# Patient Record
Sex: Female | Born: 1940
Health system: Southern US, Community
[De-identification: ages and names within clinical notes are randomized; demographics above are authoritative.]

## PROBLEM LIST (undated history)

## (undated) DIAGNOSIS — E7800 Pure hypercholesterolemia, unspecified: Secondary | ICD-10-CM

## (undated) DIAGNOSIS — I10 Essential (primary) hypertension: Secondary | ICD-10-CM

## (undated) DIAGNOSIS — M1990 Unspecified osteoarthritis, unspecified site: Secondary | ICD-10-CM

## (undated) HISTORY — PX: CARDIAC CATHETERIZATION: SHX172

---

## 2004-11-26 ENCOUNTER — Ambulatory Visit: Payer: Self-pay

## 2006-02-02 ENCOUNTER — Ambulatory Visit: Payer: Self-pay | Admitting: Nurse Practitioner

## 2006-04-22 ENCOUNTER — Ambulatory Visit: Payer: Self-pay

## 2007-04-20 ENCOUNTER — Ambulatory Visit: Payer: Self-pay | Admitting: Gastroenterology

## 2007-05-20 ENCOUNTER — Ambulatory Visit: Payer: Self-pay | Admitting: Family Medicine

## 2010-01-17 ENCOUNTER — Ambulatory Visit: Payer: Self-pay | Admitting: Family Medicine

## 2011-01-20 ENCOUNTER — Ambulatory Visit: Payer: Self-pay | Admitting: Internal Medicine

## 2012-02-24 ENCOUNTER — Ambulatory Visit: Payer: Self-pay

## 2013-03-08 ENCOUNTER — Emergency Department: Payer: Self-pay | Admitting: Internal Medicine

## 2013-10-25 ENCOUNTER — Ambulatory Visit: Payer: Self-pay | Admitting: Internal Medicine

## 2015-10-22 ENCOUNTER — Encounter: Payer: Self-pay | Admitting: *Deleted

## 2015-10-23 ENCOUNTER — Encounter
Admission: RE | Disposition: A | Discharge status: Home / Self Care | Payer: Self-pay | Source: Ambulatory Visit | Attending: Ophthalmology

## 2015-10-23 ENCOUNTER — Ambulatory Visit: Payer: Medicare Other | Admitting: Anesthesiology

## 2015-10-23 ENCOUNTER — Ambulatory Visit
Admission: RE | Admit: 2015-10-23 | Discharge: 2015-10-23 | Disposition: A | Discharge status: Home / Self Care | Payer: Medicare Other | Source: Ambulatory Visit | Attending: Ophthalmology | Admitting: Ophthalmology

## 2015-10-23 ENCOUNTER — Encounter: Payer: Self-pay | Admitting: *Deleted

## 2015-10-23 DIAGNOSIS — E7800 Pure hypercholesterolemia, unspecified: Secondary | ICD-10-CM | POA: Insufficient documentation

## 2015-10-23 DIAGNOSIS — Z79899 Other long term (current) drug therapy: Secondary | ICD-10-CM | POA: Insufficient documentation

## 2015-10-23 DIAGNOSIS — M1991 Primary osteoarthritis, unspecified site: Secondary | ICD-10-CM | POA: Diagnosis not present

## 2015-10-23 DIAGNOSIS — H2511 Age-related nuclear cataract, right eye: Secondary | ICD-10-CM | POA: Insufficient documentation

## 2015-10-23 DIAGNOSIS — I10 Essential (primary) hypertension: Secondary | ICD-10-CM | POA: Diagnosis not present

## 2015-10-23 HISTORY — PX: CATARACT EXTRACTION W/PHACO: SHX586

## 2015-10-23 HISTORY — DX: Unspecified osteoarthritis, unspecified site: M19.90

## 2015-10-23 HISTORY — DX: Essential (primary) hypertension: I10

## 2015-10-23 SURGERY — PHACOEMULSIFICATION, CATARACT, WITH IOL INSERTION
Anesthesia: Monitor Anesthesia Care | Site: Eye | Laterality: Right | Wound class: Clean

## 2015-10-23 MED ORDER — CARBACHOL 0.01 % IO SOLN
INTRAOCULAR | Status: DC | PRN
  Administered 2015-10-23: .5 mL via INTRAOCULAR

## 2015-10-23 MED ORDER — TETRACAINE HCL 0.5 % OP SOLN
1.0000 [drp] | Freq: Once | OPHTHALMIC | Status: AC
  Administered 2015-10-23: 1 [drp] via OPHTHALMIC

## 2015-10-23 MED ORDER — MOXIFLOXACIN HCL 0.5 % OP SOLN: 1.0000 [drp] | OPHTHALMIC | Status: DC | PRN

## 2015-10-23 MED ORDER — TETRACAINE HCL 0.5 % OP SOLN
OPHTHALMIC | Status: AC
  Administered 2015-10-23: 1 [drp] via OPHTHALMIC
  Filled 2015-10-23: qty 2

## 2015-10-23 MED ORDER — NA CHONDROIT SULF-NA HYALURON 40-17 MG/ML IO SOLN
INTRAOCULAR | Status: AC
  Filled 2015-10-23: qty 1

## 2015-10-23 MED ORDER — MIDAZOLAM HCL 2 MG/2ML IJ SOLN
INTRAMUSCULAR | Status: DC | PRN
  Administered 2015-10-23: 1 mg via INTRAVENOUS

## 2015-10-23 MED ORDER — POVIDONE-IODINE 5 % OP SOLN
OPHTHALMIC | Status: AC
  Administered 2015-10-23: 1 via OPHTHALMIC
  Filled 2015-10-23: qty 30

## 2015-10-23 MED ORDER — ARMC OPHTHALMIC DILATING GEL
1.0000 "application " | OPHTHALMIC | Status: AC | PRN
  Administered 2015-10-23 (×2): 1 via OPHTHALMIC

## 2015-10-23 MED ORDER — ARMC OPHTHALMIC DILATING GEL
OPHTHALMIC | Status: AC
  Administered 2015-10-23: 1 via OPHTHALMIC
  Filled 2015-10-23: qty 0.25

## 2015-10-23 MED ORDER — POVIDONE-IODINE 5 % OP SOLN
1.0000 "application " | Freq: Once | OPHTHALMIC | Status: AC
  Administered 2015-10-23: 1 via OPHTHALMIC

## 2015-10-23 MED ORDER — EPINEPHRINE HCL 1 MG/ML IJ SOLN
INTRAMUSCULAR | Status: DC | PRN
  Administered 2015-10-23: 1 mL via OPHTHALMIC

## 2015-10-23 MED ORDER — MOXIFLOXACIN HCL 0.5 % OP SOLN
OPHTHALMIC | Status: AC
  Filled 2015-10-23: qty 3

## 2015-10-23 MED ORDER — FENTANYL CITRATE (PF) 100 MCG/2ML IJ SOLN
INTRAMUSCULAR | Status: DC | PRN
  Administered 2015-10-23: 50 ug via INTRAVENOUS

## 2015-10-23 MED ORDER — MOXIFLOXACIN HCL 0.5 % OP SOLN
OPHTHALMIC | Status: DC | PRN
  Administered 2015-10-23: 1 [drp] via OPHTHALMIC

## 2015-10-23 MED ORDER — EPINEPHRINE HCL 1 MG/ML IJ SOLN
INTRAMUSCULAR | Status: AC
  Filled 2015-10-23: qty 1

## 2015-10-23 MED ORDER — CEFUROXIME OPHTHALMIC INJECTION 1 MG/0.1 ML
INJECTION | OPHTHALMIC | Status: AC
  Filled 2015-10-23: qty 0.1

## 2015-10-23 MED ORDER — CEFUROXIME OPHTHALMIC INJECTION 1 MG/0.1 ML
INJECTION | OPHTHALMIC | Status: DC | PRN
  Administered 2015-10-23: .1 mL via INTRACAMERAL

## 2015-10-23 MED ORDER — SODIUM CHLORIDE 0.9 % IV SOLN
INTRAVENOUS | Status: DC
  Administered 2015-10-23: 10:00:00 via INTRAVENOUS

## 2015-10-23 MED ORDER — NA CHONDROIT SULF-NA HYALURON 40-17 MG/ML IO SOLN
INTRAOCULAR | Status: DC | PRN
  Administered 2015-10-23: 1 mL via INTRAOCULAR

## 2015-10-23 SURGICAL SUPPLY — 22 items
CANNULA ANT/CHMB 27GA (MISCELLANEOUS) ×3 IMPLANT
CUP MEDICINE 2OZ PLAST GRAD ST (MISCELLANEOUS) ×3 IMPLANT
GLOVE BIO SURGEON STRL SZ8 (GLOVE) ×3 IMPLANT
GLOVE BIOGEL M 6.5 STRL (GLOVE) ×3 IMPLANT
GLOVE SURG LX 8.0 MICRO (GLOVE) ×2
GLOVE SURG LX STRL 8.0 MICRO (GLOVE) ×1 IMPLANT
GOWN STRL REUS W/ TWL LRG LVL3 (GOWN DISPOSABLE) ×2 IMPLANT
GOWN STRL REUS W/TWL LRG LVL3 (GOWN DISPOSABLE) ×4
LENS IOL TECNIS 20.0 (Intraocular Lens) ×3 IMPLANT
LENS IOL TECNIS MONO 1P 20.0 (Intraocular Lens) ×1 IMPLANT
PACK CATARACT (MISCELLANEOUS) ×3 IMPLANT
PACK CATARACT BRASINGTON LX (MISCELLANEOUS) ×3 IMPLANT
PACK EYE AFTER SURG (MISCELLANEOUS) ×3 IMPLANT
SOL BSS BAG (MISCELLANEOUS) ×3
SOL PREP PVP 2OZ (MISCELLANEOUS) ×3
SOLUTION BSS BAG (MISCELLANEOUS) ×1 IMPLANT
SOLUTION PREP PVP 2OZ (MISCELLANEOUS) ×1 IMPLANT
SYR 3ML LL SCALE MARK (SYRINGE) ×3 IMPLANT
SYR 5ML LL (SYRINGE) ×3 IMPLANT
SYR TB 1ML 27GX1/2 LL (SYRINGE) ×3 IMPLANT
WATER STERILE IRR 1000ML POUR (IV SOLUTION) ×3 IMPLANT
WIPE NON LINTING 3.25X3.25 (MISCELLANEOUS) ×3 IMPLANT

## 2015-10-23 NOTE — Transfer of Care (Signed)
Immediate Anesthesia Transfer of Care Note  Patient: Emily Bolton  Procedure(s) Performed: Procedure(s) with comments: CATARACT EXTRACTION PHACO AND INTRAOCULAR LENS PLACEMENT (IOC) (Right) - US 00:47 AP% 27.4 CDE 13.00 fluid pack lot # [id number]  Patient Location: PACU  Anesthesia Type:MAC  Level of Consciousness: awake, alert  and oriented  Airway & Oxygen Therapy: Patient Spontanous Breathing  Post-op Assessment: Report given to RN and Post -op Vital signs reviewed and stable  Post vital signs: Reviewed and stable  Last Vitals:  Filed Vitals:   10/23/15 0944 10/23/15 1120  BP: 163/65 151/71  Pulse: 76 74  Temp: 36.2 C   Resp: 16     Last Pain: There were no vitals filed for this visit.       Complications: No apparent anesthesia complications

## 2015-10-23 NOTE — Anesthesia Preprocedure Evaluation (Signed)
Anesthesia Evaluation  Patient identified by MRN, date of birth, ID band Patient awake    Reviewed: Allergy & Precautions, H&P , NPO status , Patient's Chart, lab work & pertinent test results  History of Anesthesia Complications Negative for: history of anesthetic complications  Airway Mallampati: III  TM Distance: >3 FB Neck ROM: limited    Dental  (+) Poor Dentition, Missing, Upper Dentures, Lower Dentures   Pulmonary neg pulmonary ROS, neg shortness of breath,    Pulmonary exam normal breath sounds clear to auscultation       Cardiovascular Exercise Tolerance: Good hypertension, negative cardio ROS Normal cardiovascular exam Rhythm:regular Rate:Normal     Neuro/Psych negative neurological ROS  negative psych ROS   GI/Hepatic negative GI ROS, Neg liver ROS,   Endo/Other  negative endocrine ROS  Renal/GU negative Renal ROS  negative genitourinary   Musculoskeletal  (+) Arthritis ,   Abdominal   Peds  Hematology negative hematology ROS (+)   Anesthesia Other Findings Past Medical History:   Hypertension                                                 Arthritis                   HLD                                  History reviewed. No pertinent surgical history.  BMI    Body Mass Index   19.73 kg/m 2      Reproductive/Obstetrics negative OB ROS                             Anesthesia Physical Anesthesia Plan  ASA: III  Anesthesia Plan: MAC   Post-op Pain Management:    Induction:   Airway Management Planned:   Additional Equipment:   Intra-op Plan:   Post-operative Plan:   Informed Consent: I have reviewed the patients History and Physical, chart, labs and discussed the procedure including the risks, benefits and alternatives for the proposed anesthesia with the patient or authorized representative who has indicated his/her understanding and acceptance.   Dental  Advisory Given  Plan Discussed with: Anesthesiologist, CRNA and Surgeon  Anesthesia Plan Comments: (Consent via video interpreter )        Anesthesia Quick Evaluation

## 2015-10-23 NOTE — Anesthesia Postprocedure Evaluation (Signed)
Anesthesia Post Note  Patient: Emily Bolton  Procedure(s) Performed: Procedure(s) (LRB): CATARACT EXTRACTION PHACO AND INTRAOCULAR LENS PLACEMENT (IOC) (Right)  Patient location during evaluation: PACU Anesthesia Type: MAC Level of consciousness: awake, awake and alert and oriented Pain management: pain level controlled Vital Signs Assessment: post-procedure vital signs reviewed and stable Respiratory status: spontaneous breathing, nonlabored ventilation and respiratory function stable Cardiovascular status: stable Anesthetic complications: no    Last Vitals:  Filed Vitals:   10/23/15 0944 10/23/15 1120  BP: 163/65 151/71  Pulse: 76 74  Temp: 36.2 C   Resp: 16     Last Pain: There were no vitals filed for this visit.               [employer]so[employer]

## 2015-10-23 NOTE — Op Note (Signed)
PREOPERATIVE DIAGNOSIS:  Nuclear sclerotic cataract of the right eye.   POSTOPERATIVE DIAGNOSIS: nuclear sclerotic cataract right eye   OPERATIVE PROCEDURE:  Procedure(s): CATARACT EXTRACTION PHACO AND INTRAOCULAR LENS PLACEMENT (IOC)   SURGEON:  Galen ManilaWilliam Dyesha Henault, MD.   ANESTHESIA:  Anesthesiologist: Rosaria FerriesJoseph K Piscitello, MD CRNA: Casey Burkitthuy Hoang, CRNA  1.      Managed anesthesia care. 2.      Topical tetracaine drops followed by 2% Xylocaine jelly applied in the preoperative holding area.   COMPLICATIONS:  None.   TECHNIQUE:   Stop and chop   DESCRIPTION OF PROCEDURE:  The patient was examined and consented in the preoperative holding area where the aforementioned topical anesthesia was applied to the right eye and then brought back to the Operating Room where the right eye was prepped and draped in the usual sterile ophthalmic fashion and a lid speculum was placed. A paracentesis was created with the side port blade and the anterior chamber was filled with viscoelastic. A near clear corneal incision was performed with the steel keratome. A continuous curvilinear capsulorrhexis was performed with a cystotome followed by the capsulorrhexis forceps. Hydrodissection and hydrodelineation were carried out with BSS on a blunt cannula. The lens was removed in a stop and chop  technique and the remaining cortical material was removed with the irrigation-aspiration handpiece. The capsular bag was inflated with viscoelastic and the Technis ZCB00  lens was placed in the capsular bag without complication. The remaining viscoelastic was removed from the eye with the irrigation-aspiration handpiece. The wounds were hydrated. The anterior chamber was flushed with Miostat and the eye was inflated to physiologic pressure. 0.1 mL of cefuroxime concentration 10 mg/mL was placed in the anterior chamber. The wounds were found to be water tight. The eye was dressed with Vigamox. The patient was given protective glasses to  wear throughout the day and a shield with which to sleep tonight. The patient was also given drops with which to begin a drop regimen today and will follow-up with me in one day.  Implant Name Type Inv. Item Serial No. Manufacturer Lot No. LRB No. Used  LENS IOL TECNIS 20.0 - [id number] Intraocular Lens LENS IOL TECNIS 20.0 [id number] AMO [id number] Right 1   Procedure(s) with comments: CATARACT EXTRACTION PHACO AND INTRAOCULAR LENS PLACEMENT (IOC) (Right) - US 00:47 AP% 27.4 CDE 13.00 fluid pack lot # [id number]  Electronically signed: Kendric Sindelar LOUIS 10/23/2015 11:19 AM

## 2015-10-23 NOTE — H&P (Signed)
  All labs reviewed. Abnormal studies sent to patients PCP when indicated.  Previous H&P reviewed, patient examined, there are NO CHANGES.  Emily Bolton LOUIS5/9/201710:54 AM

## 2015-10-23 NOTE — Discharge Instructions (Signed)
Eye Surgery Discharge Instructions  Expect mild scratchy sensation or mild soreness. DO NOT RUB YOUR EYE!  The day of surgery:  Minimal physical activity, but bed rest is not required  No reading, computer work, or close hand work  No bending, lifting, or straining.  May watch TV  For 24 hours:  No driving, legal decisions, or alcoholic beverages  Safety precautions  Eat anything you prefer: It is better to start with liquids, then soup then solid foods.  _____ Eye patch should be worn until postoperative exam tomorrow.  ____ Solar shield eyeglasses should be worn for comfort in the sunlight/patch while sleeping  Resume all regular medications including aspirin or Coumadin if these were discontinued prior to surgery. You may shower, bathe, shave, or wash your hair. Tylenol may be taken for mild discomfort.  Call your doctor if you experience significant pain, nausea, or vomiting, fever > 101 or other signs of infection. [phone] or [phone] Specific instructions:  Follow-up Information    Follow up with Carlena BjornstadPORFILIO,WILLIAM LOUIS, MD.   Specialty:  Ophthalmology   Why:  10-24-15 at 9:45   Contact information:   [address] [place] [address] [phone]      Eye Surgery Discharge Instructions  Expect mild scratchy sensation or mild soreness. DO NOT RUB YOUR EYE!  The day of surgery:  Minimal physical activity, but bed rest is not required  No reading, computer work, or close hand work  No bending, lifting, or straining.  May watch TV  For 24 hours:  No driving, legal decisions, or alcoholic beverages  Safety precautions  Eat anything you prefer: It is better to start with liquids, then soup then solid foods.  _____ Eye patch should be worn until postoperative exam tomorrow.  ____ Solar shield eyeglasses should be worn for comfort in the sunlight/patch while sleeping  Resume all regular medications including aspirin or Coumadin if  these were discontinued prior to surgery. You may shower, bathe, shave, or wash your hair. Tylenol may be taken for mild discomfort.  Call your doctor if you experience significant pain, nausea, or vomiting, fever > 101 or other signs of infection. [phone] or [phone] Specific instructions:  Follow-up Information    Follow up with Carlena BjornstadPORFILIO,WILLIAM LOUIS, MD.   Specialty:  Ophthalmology   Why:  10-24-15 at 9:45   Contact information:   [address] [place] [address] [phone]

## 2015-11-06 ENCOUNTER — Encounter: Payer: Self-pay | Admitting: *Deleted

## 2015-11-09 ENCOUNTER — Encounter: Payer: Self-pay | Admitting: *Deleted

## 2015-11-12 ENCOUNTER — Emergency Department: Payer: Medicare Other

## 2015-11-12 ENCOUNTER — Emergency Department
Admission: EM | Admit: 2015-11-12 | Discharge: 2015-11-12 | Disposition: A | Discharge status: Home / Self Care | Payer: Medicare Other | Attending: Emergency Medicine | Admitting: Emergency Medicine

## 2015-11-12 ENCOUNTER — Encounter: Payer: Self-pay | Admitting: Emergency Medicine

## 2015-11-12 ENCOUNTER — Other Ambulatory Visit: Payer: Self-pay

## 2015-11-12 DIAGNOSIS — R509 Fever, unspecified: Secondary | ICD-10-CM

## 2015-11-12 DIAGNOSIS — K529 Noninfective gastroenteritis and colitis, unspecified: Secondary | ICD-10-CM | POA: Diagnosis not present

## 2015-11-12 DIAGNOSIS — M545 Low back pain: Secondary | ICD-10-CM | POA: Diagnosis present

## 2015-11-12 DIAGNOSIS — I10 Essential (primary) hypertension: Secondary | ICD-10-CM | POA: Insufficient documentation

## 2015-11-12 DIAGNOSIS — M1990 Unspecified osteoarthritis, unspecified site: Secondary | ICD-10-CM | POA: Insufficient documentation

## 2015-11-12 LAB — CBC WITH DIFFERENTIAL/PLATELET
Basophils Absolute: 0 10*3/uL (ref 0–0.1)
Eosinophils Absolute: 0.3 10*3/uL (ref 0–0.7)
Eosinophils Relative: 3 %
HEMATOCRIT: 27.1 % — AB (ref 35.0–47.0)
Hemoglobin: 9.2 g/dL — ABNORMAL LOW (ref 12.0–16.0)
Lymphocytes Relative: 17 %
Lymphs Abs: 1.7 10*3/uL (ref 1.0–3.6)
MCH: 28.9 pg (ref 26.0–34.0)
MCHC: 34 g/dL (ref 32.0–36.0)
MCV: 85 fL (ref 80.0–100.0)
MONO ABS: 1.3 10*3/uL — AB (ref 0.2–0.9)
Neutro Abs: 6.7 10*3/uL — ABNORMAL HIGH (ref 1.4–6.5)
Neutrophils Relative %: 67 %
Platelets: 197 10*3/uL (ref 150–440)
RBC: 3.19 MIL/uL — ABNORMAL LOW (ref 3.80–5.20)
RDW: 13.5 % (ref 11.5–14.5)
WBC: 10 10*3/uL (ref 3.6–11.0)

## 2015-11-12 LAB — COMPREHENSIVE METABOLIC PANEL
ALBUMIN: 3.9 g/dL (ref 3.5–5.0)
ALT: 12 U/L — ABNORMAL LOW (ref 14–54)
AST: 20 U/L (ref 15–41)
Alkaline Phosphatase: 26 U/L — ABNORMAL LOW (ref 38–126)
Anion gap: 10 (ref 5–15)
BILIRUBIN TOTAL: 0.4 mg/dL (ref 0.3–1.2)
BUN: 24 mg/dL — AB (ref 6–20)
CHLORIDE: 101 mmol/L (ref 101–111)
CO2: 25 mmol/L (ref 22–32)
CREATININE: 1.43 mg/dL — AB (ref 0.44–1.00)
Calcium: 8.6 mg/dL — ABNORMAL LOW (ref 8.9–10.3)
GFR calc Af Amer: 41 mL/min — ABNORMAL LOW (ref >60)
GFR, EST NON AFRICAN AMERICAN: 35 mL/min — AB (ref >60)
Glucose, Bld: 118 mg/dL — ABNORMAL HIGH (ref 65–99)
Potassium: 3.7 mmol/L (ref 3.5–5.1)
SODIUM: 136 mmol/L (ref 135–145)
Total Protein: 7.4 g/dL (ref 6.5–8.1)

## 2015-11-12 LAB — URINALYSIS COMPLETE WITH MICROSCOPIC (ARMC ONLY)
BILIRUBIN URINE: NEGATIVE
Bacteria, UA: NONE SEEN
GLUCOSE, UA: NEGATIVE mg/dL
Hgb urine dipstick: NEGATIVE
Ketones, ur: NEGATIVE mg/dL
NITRITE: NEGATIVE
Protein, ur: NEGATIVE mg/dL
SQUAMOUS EPITHELIAL / LPF: NONE SEEN
Specific Gravity, Urine: 1.013 (ref 1.005–1.030)
pH: 6 (ref 5.0–8.0)

## 2015-11-12 LAB — LACTIC ACID, PLASMA
LACTIC ACID, VENOUS: 1.6 mmol/L (ref 0.5–2.0)
Lactic Acid, Venous: 1 mmol/L (ref 0.5–2.0)

## 2015-11-12 MED ORDER — CIPROFLOXACIN HCL 500 MG PO TABS
500.0000 mg | ORAL_TABLET | Freq: Once | ORAL | Status: AC
  Administered 2015-11-12: 500 mg via ORAL
  Filled 2015-11-12: qty 1

## 2015-11-12 MED ORDER — HYDROCODONE-ACETAMINOPHEN 5-325 MG PO TABS
1.0000 | ORAL_TABLET | Freq: Once | ORAL | Status: AC
  Administered 2015-11-12: 1 via ORAL
  Filled 2015-11-12: qty 1

## 2015-11-12 MED ORDER — METRONIDAZOLE 500 MG PO TABS: 500.0000 mg | ORAL_TABLET | Freq: Three times a day (TID) | ORAL | Status: AC

## 2015-11-12 MED ORDER — HYDROCODONE-ACETAMINOPHEN 5-325 MG PO TABS: 1.0000 | ORAL_TABLET | ORAL | Status: AC | PRN

## 2015-11-12 MED ORDER — CIPROFLOXACIN HCL 500 MG PO TABS: 500.0000 mg | ORAL_TABLET | Freq: Two times a day (BID) | ORAL | Status: AC

## 2015-11-12 MED ORDER — IOPAMIDOL (ISOVUE-300) INJECTION 61%
80.0000 mL | Freq: Once | INTRAVENOUS | Status: AC | PRN
  Administered 2015-11-12: 75 mL via INTRAVENOUS

## 2015-11-12 MED ORDER — METRONIDAZOLE 500 MG PO TABS
500.0000 mg | ORAL_TABLET | Freq: Once | ORAL | Status: AC
  Administered 2015-11-12: 500 mg via ORAL
  Filled 2015-11-12: qty 1

## 2015-11-12 MED ORDER — DIATRIZOATE MEGLUMINE & SODIUM 66-10 % PO SOLN
15.0000 mL | ORAL | Status: AC
  Administered 2015-11-12 (×2): 15 mL via ORAL

## 2015-11-12 MED ORDER — SODIUM CHLORIDE 0.9 % IV BOLUS (SEPSIS)
1000.0000 mL | Freq: Once | INTRAVENOUS | Status: AC
  Administered 2015-11-12: 1000 mL via INTRAVENOUS

## 2015-11-12 NOTE — Discharge Instructions (Signed)
Please take your antibiotics as prescribed. Pain medication as needed. Please follow-up your primary care physician in 24-48 hours for recheck/reevaluation. Return to the emergency department if you have worsening abdominal pain, Fever greater than 101, Or any other symptom personally concerning to yourself.    ?au b?ng, Ng??i l?n  (Abdominal Pain, Adult)    C nhi?u nguyn nhn d?n ??n ?au b?ng. Thng th??ng ?au b?ng l do m?t b?nh gy ra v s? khng ?? n?u khng ?i?u tr?Marland Kitchen. B?nh ny c th? ???c theo di v ?i?u tr? t?i nh. Chuyn gia ch?m [place] s?c kh?e s? ti?n hnh khm th?c th? v c th? yu c?u lm xt nghi?m mu v ch?p X quang ?? xc ??nh m?c ?? nghim tr?ng c?a c?n ?au b?ng. Tuy nhin, trong nhi?u tr??ng h?p, ph?i m?t nhi?u th?i gian h?n ?? xc ??nh r nguyn nhn gy ?au b?ng. Tr??c khi tm ra nguyn nhn, chuyn gia ch?m [place] s?c kh?e c th? khng bi?t li?u qu v? c c?n lm thm xt nghi?m ho?c ti?p t?c ?i?u tr? hay khng.  H??NG D?N CH?M [place] T?I NH  Theo di c?n ?au b?ng xem c b?t k? thay ??i no khng. Nh?ng hnh ??ng sau c th? gip lo?i b? b?t c? c?m gic kh ch?u no qu v? ?ang b?.  Ch? s? d?ng thu?c khng c?n k ??n ho?c thu?c c?n k ??n theo ch? d?n c?a chuyn gia ch?m [place] s?c kh?e.  Khng dng thu?c nhu?n trng tr? khi ???c chuyn gia ch?m [place] s?c kh?e ch? ??nh.  Th? dng m?t ch? ?? ?n l?ng (n??c lu?c th?t, tr, ho?c n??c) theo ch? d?n c?a chuyn gia ch?m [place] s?c kh?e. Chuy?n d?n sang m?t ch? ?? ?n nh? n?u ch?u ???c. ?I KHM N?U:  Qu v? b? ?au b?ng khng r nguyn nhn.  Qu v? b? ?au b?ng km theo bu?n nn ho?c tiu ch?y.  Qu v? b? ?au khi ?i ti?u ho?c ?i ngoi.  Qu v? b? c?n ?au b?ng lm th?c gi?c vo ban ?m.  Qu v? b? ?au b?ng n?ng thm ho?c ?? h?n khi ?n.  Qu v? b? ?au b?ng n?ng thm khi ?n ?? ?n nhi?u ch?t bo.  Qu v? b? s?t. NGAY L?P T?C ?I KHM N?U:  C?n ?au khng kh?i trong vng 2 gi?Ladell Heads.  Qu v? v?n ti?p t?c b? i (nn m?a).  Ch? c th? c?m th?y ?au ? m?t s?  ph?n b?ng, ch?ng h?n nh? ? ph?n b?ng bn ph?i ho?c bn d??i tri.  Phn c?a qu v? c mu ho?c c mu ?en nh? h?c n. ??M B?O QU V?:  Hi?u r cc h??ng d?n ny.  S? theo di tnh tr?ng c?a mnh.  S? yu c?u tr? gip ngay l?p t?c n?u qu v? c?m th?y khng kh?e ho?c th?y tr?m tr?ng h?n. Thng tin ny khng nh?m m?c ?ch thay th? cho l?i khuyn m chuyn gia ch?m  s?c kh?e ni v?i qu v?. Hy b?o ??m qu v? ph?i th?o lu?n b?t k? v?n ?? g m qu v? c v?i chuyn gia ch?m  s?c kh?e c?a qu v?.  Document Released: 06/02/2005 Document Revised: 06/23/2014  Elsevier Interactive Patient Education [employer]2016 Elsevier Inc.

## 2015-11-12 NOTE — ED Notes (Signed)
Patient transported to CT 

## 2015-11-12 NOTE — ED Notes (Addendum)
Used Therapist, sportsvideo vietnamese interpreter [phone]).  Patient reports back pain x 1 week that is radiates into shoulders and neck.  Patient is also complaining of left hip and leg pain.  Patient states her left leg has been hurting her x 2 weeks.  Patient denies having fallen any in the past several weeks.  Patient denies any injury.  Patient states her doctor gave her some pain medication but it has not been helping.  Patient has a fever during triage.  Patient denies shortness of breath.  Patient denies any urinary frequency or burning.  Patient denies cough or congestion.  Patient is complaining of sharp headache x 4 days.    Patient is smiling occasionally during triage.  Patient is oriented to self, and place but not time.

## 2015-11-12 NOTE — ED Provider Notes (Signed)
[facility] Emergency Department Provider Note  Time seen: 8:29 PM  I have reviewed the triage vital signs and the nursing notes.   HISTORY  Chief Complaint Fever; Back Pain; and Weakness    HPI Emily Bolton is a 75 y.o. female with a past medical history of hypertension, hyperlipidemia, arthritis presents the emergency department with lower abdominal and lower back pain for the past one week with some radiation into bilateral flanks and shoulders. Denies any chest pain, trouble breathing. Denies nausea, vomiting, diarrhea, dysuria. Denies cough or congestion. Patient is describing mostly lower abdominal and lower back pain with occasional radiation of the pain, but when asked where she hurts she holds her lower abdomen and around to her bilateral flanks. Seems hurt somewhat worse on the left side per patient. Patient does have a fever in the emergency department 100.6.     Past Medical History  Diagnosis Date  . Hypertension   . Arthritis   . Hypercholesteremia     There are no active problems to display for this patient.   Past Surgical History  Procedure Laterality Date  . Cataract extraction w/phaco Right 10/23/2015    Procedure: CATARACT EXTRACTION PHACO AND INTRAOCULAR LENS PLACEMENT (IOC);  Surgeon: Galen ManilaWilliam Porfilio, MD;  Location: [facility] ORS;  Service: Ophthalmology;  Laterality: Right;  US 00:47 AP% 27.4 CDE 13.00 fluid pack lot # [id number]  . Cardiac catheterization      Current Outpatient Rx  Name  Route  Sig  Dispense  Refill  . atorvastatin (LIPITOR) 40 MG tablet   Oral   Take 40 mg by mouth daily.         Marland Kitchen. losartan-hydrochlorothiazide (HYZAAR) 100-12.5 MG tablet   Oral   Take 1 tablet by mouth daily.         . MELOXICAM PO   Oral   Take by mouth.           Allergies Review of patient's allergies indicates no known allergies.  No family history on file.  Social History Social History  Substance Use Topics  . Smoking  status: Never Smoker   . Smokeless tobacco: None  . Alcohol Use: No    Review of Systems Constitutional: Positive for fever in emergency department. Cardiovascular: Negative for chest pain. Respiratory: Negative for shortness of breath. Gastrointestinal: Lower abdominal pain, lower back pain. Negative for nausea, vomiting, diarrhea. Genitourinary: Negative for dysuria. Musculoskeletal: Lower back pain. Neurological: Negative for headache 10-point ROS otherwise negative.  ____________________________________________   PHYSICAL EXAM:  VITAL SIGNS: ED Triage Vitals  Enc Vitals Group     BP 11/12/15 1739 133/57 mmHg     Pulse Rate 11/12/15 1739 100     Resp 11/12/15 1739 20     Temp 11/12/15 1739 100.6 F (38.1 C)     Temp Source 11/12/15 1739 Oral     SpO2 11/12/15 1739 97 %     Weight 11/12/15 1739 114 lb (51.71 kg)     Height 11/12/15 1739 4' 11.06" (1.5 m)     Head Cir --      Peak Flow --      Pain Score --      Pain Loc --      Pain Edu? --      Excl. in GC? --     Constitutional: Alert and oriented. Well appearing and in no distress. Eyes: Normal exam ENT   Head: Normocephalic and atraumatic.   Mouth/Throat: Mucous membranes are moist. Cardiovascular: Normal  rate, regular rhythm. No murmur Respiratory: Normal respiratory effort without tachypnea nor retractions. Breath sounds are clear  Gastrointestinal: Soft and nontender. No distention.  Musculoskeletal: Nontender with normal range of motion in all extremities.  Neurologic:  Normal speech and language. No gross focal neurologic deficits Skin:  Skin is warm, dry and intact.  Psychiatric: Mood and affect are normal.  ____________________________________________    EKG  EKG reviewed and interpreted by myself shows normal sinus rhythm at 94 bpm, narrow QRS, normal axis, normal intervals, nonspecific ST changes.  ____________________________________________    RADIOLOGY  CT shows possible  enteritis.  ____________________________________________   INITIAL IMPRESSION / ASSESSMENT AND PLAN / ED COURSE  Pertinent labs & imaging results that were available during my care of the patient were reviewed by me and considered in my medical decision making (see chart for details).  The patient presents to the emergency department for lower abdominal and lower back pain, but also describes radiation of the pain into her flanks, into her shoulders and neck, and 4 days of mild to moderate headache. When asked where most of her discomfort as she points to her lower abdomen and off to her left flank. Patient's labs are largely nonrevealing. As the patient did have a fever upon presentation to the emergency department we'll proceed with a CT abdomen/pelvis help further evaluate. On exam the patient does have mild to moderate lower abdominal pain. Patient is urinalysis is negative. Patient denies diarrhea, however the patient could possibly be experiencing: Eyes are diverticulitis, possibly an atypical appendicitis. No right upper quadrant tenderness. Liver function tests are normal. Overall it is a difficult examination as the patient speaks [place] and we are using a video interpreter for the evaluation.   Patient's labs have resulted largely within normal limits. Lactate is normal. Patient CT scan shows air-fluid levels, but no obvious obstruction or infection. Likely suggestive of enteritis. Given the patient's abdominal pain and fever we will cover with antibiotics (Cipro/Flagyl), pain medication as needed, and have the patient follow up with her primary care doctor within one to 2 days for recheck/reevaluation. I discussed very strict return precautions with the patient via a hospital interpreter. Patient and family are agreeable to this plan. As we did dose IV contrast with borderline kidney function we will dose and additional liter of fluid to ensure adequate kidney  hydration.  ____________________________________________   FINAL CLINICAL IMPRESSION(S) / ED DIAGNOSES  Abdominal pain Fever Enteritis  Minna Antis, MD 11/12/15 2246

## 2015-11-13 ENCOUNTER — Ambulatory Visit: Payer: Medicare Other | Admitting: Anesthesiology

## 2015-11-13 ENCOUNTER — Ambulatory Visit
Admission: RE | Admit: 2015-11-13 | Discharge: 2015-11-13 | Disposition: A | Discharge status: Home / Self Care | Payer: Medicare Other | Source: Ambulatory Visit | Attending: Ophthalmology | Admitting: Ophthalmology

## 2015-11-13 ENCOUNTER — Encounter
Admission: RE | Disposition: A | Discharge status: Home / Self Care | Payer: Self-pay | Source: Ambulatory Visit | Attending: Ophthalmology

## 2015-11-13 DIAGNOSIS — H2512 Age-related nuclear cataract, left eye: Secondary | ICD-10-CM | POA: Diagnosis not present

## 2015-11-13 DIAGNOSIS — M1990 Unspecified osteoarthritis, unspecified site: Secondary | ICD-10-CM | POA: Diagnosis not present

## 2015-11-13 DIAGNOSIS — I10 Essential (primary) hypertension: Secondary | ICD-10-CM | POA: Insufficient documentation

## 2015-11-13 DIAGNOSIS — E785 Hyperlipidemia, unspecified: Secondary | ICD-10-CM | POA: Insufficient documentation

## 2015-11-13 HISTORY — DX: Pure hypercholesterolemia, unspecified: E78.00

## 2015-11-13 HISTORY — PX: CATARACT EXTRACTION W/PHACO: SHX586

## 2015-11-13 SURGERY — PHACOEMULSIFICATION, CATARACT, WITH IOL INSERTION
Anesthesia: Monitor Anesthesia Care | Laterality: Left

## 2015-11-13 MED ORDER — CARBACHOL 0.01 % IO SOLN
INTRAOCULAR | Status: DC | PRN
  Administered 2015-11-13: 0.5 mL via INTRAOCULAR

## 2015-11-13 MED ORDER — POVIDONE-IODINE 5 % OP SOLN
1.0000 "application " | Freq: Once | OPHTHALMIC | Status: AC
  Administered 2015-11-13: 1 via OPHTHALMIC

## 2015-11-13 MED ORDER — SODIUM CHLORIDE 0.9 % IV SOLN
INTRAVENOUS | Status: DC
  Administered 2015-11-13 (×2): via INTRAVENOUS

## 2015-11-13 MED ORDER — NA CHONDROIT SULF-NA HYALURON 40-17 MG/ML IO SOLN
INTRAOCULAR | Status: DC | PRN
  Administered 2015-11-13: 1 mL via INTRAOCULAR

## 2015-11-13 MED ORDER — MOXIFLOXACIN HCL 0.5 % OP SOLN
OPHTHALMIC | Status: AC
  Filled 2015-11-13: qty 3

## 2015-11-13 MED ORDER — NA CHONDROIT SULF-NA HYALURON 40-17 MG/ML IO SOLN
INTRAOCULAR | Status: AC
Start: 2015-11-13 — End: 2015-11-13
  Filled 2015-11-13: qty 1

## 2015-11-13 MED ORDER — CEFUROXIME OPHTHALMIC INJECTION 1 MG/0.1 ML
INJECTION | OPHTHALMIC | Status: AC
  Filled 2015-11-13: qty 0.1

## 2015-11-13 MED ORDER — MOXIFLOXACIN HCL 0.5 % OP SOLN
OPHTHALMIC | Status: DC | PRN
  Administered 2015-11-13: 1 [drp] via OPHTHALMIC

## 2015-11-13 MED ORDER — ARMC OPHTHALMIC DILATING GEL
OPHTHALMIC | Status: AC
  Administered 2015-11-13: 1 via OPHTHALMIC
  Filled 2015-11-13: qty 0.25

## 2015-11-13 MED ORDER — FENTANYL CITRATE (PF) 100 MCG/2ML IJ SOLN
INTRAMUSCULAR | Status: DC | PRN
  Administered 2015-11-13: 50 ug via INTRAVENOUS

## 2015-11-13 MED ORDER — TETRACAINE HCL 0.5 % OP SOLN
OPHTHALMIC | Status: AC
  Administered 2015-11-13: 1 [drp] via OPHTHALMIC
  Filled 2015-11-13: qty 2

## 2015-11-13 MED ORDER — MOXIFLOXACIN HCL 0.5 % OP SOLN: 1.0000 [drp] | OPHTHALMIC | Status: DC | PRN

## 2015-11-13 MED ORDER — EPINEPHRINE HCL 1 MG/ML IJ SOLN
INTRAOCULAR | Status: DC | PRN
  Administered 2015-11-13: 11:00:00 via OPHTHALMIC

## 2015-11-13 MED ORDER — TETRACAINE HCL 0.5 % OP SOLN
1.0000 [drp] | Freq: Once | OPHTHALMIC | Status: AC
  Administered 2015-11-13: 1 [drp] via OPHTHALMIC

## 2015-11-13 MED ORDER — EPINEPHRINE HCL 1 MG/ML IJ SOLN
INTRAMUSCULAR | Status: AC
  Filled 2015-11-13: qty 1

## 2015-11-13 MED ORDER — CEFUROXIME OPHTHALMIC INJECTION 1 MG/0.1 ML
INJECTION | OPHTHALMIC | Status: DC | PRN
  Administered 2015-11-13: 0.1 mL via INTRACAMERAL

## 2015-11-13 MED ORDER — ARMC OPHTHALMIC DILATING GEL
1.0000 "application " | OPHTHALMIC | Status: AC | PRN
  Administered 2015-11-13 (×2): 1 via OPHTHALMIC

## 2015-11-13 MED ORDER — POVIDONE-IODINE 5 % OP SOLN
OPHTHALMIC | Status: AC
  Administered 2015-11-13: 1 via OPHTHALMIC
  Filled 2015-11-13: qty 30

## 2015-11-13 MED ORDER — MIDAZOLAM HCL 2 MG/2ML IJ SOLN
INTRAMUSCULAR | Status: DC | PRN
  Administered 2015-11-13: 1 mg via INTRAVENOUS

## 2015-11-13 SURGICAL SUPPLY — 21 items
CANNULA ANT/CHMB 27GA (MISCELLANEOUS) ×3 IMPLANT
CUP MEDICINE 2OZ PLAST GRAD ST (MISCELLANEOUS) ×3 IMPLANT
GLOVE BIO SURGEON STRL SZ8 (GLOVE) ×3 IMPLANT
GLOVE BIOGEL M 6.5 STRL (GLOVE) ×3 IMPLANT
GLOVE SURG LX 8.0 MICRO (GLOVE) ×2
GLOVE SURG LX STRL 8.0 MICRO (GLOVE) ×1 IMPLANT
GOWN STRL REUS W/ TWL LRG LVL3 (GOWN DISPOSABLE) ×2 IMPLANT
GOWN STRL REUS W/TWL LRG LVL3 (GOWN DISPOSABLE) ×4
LENS IOL TECNIS ITEC 21.0 (Intraocular Lens) ×3 IMPLANT
PACK CATARACT (MISCELLANEOUS) ×3 IMPLANT
PACK CATARACT BRASINGTON LX (MISCELLANEOUS) ×3 IMPLANT
PACK EYE AFTER SURG (MISCELLANEOUS) ×3 IMPLANT
SOL BSS BAG (MISCELLANEOUS) ×3
SOL PREP PVP 2OZ (MISCELLANEOUS) ×3
SOLUTION BSS BAG (MISCELLANEOUS) ×1 IMPLANT
SOLUTION PREP PVP 2OZ (MISCELLANEOUS) ×1 IMPLANT
SYR 3ML LL SCALE MARK (SYRINGE) ×3 IMPLANT
SYR 5ML LL (SYRINGE) ×3 IMPLANT
SYR TB 1ML 27GX1/2 LL (SYRINGE) ×3 IMPLANT
WATER STERILE IRR 1000ML POUR (IV SOLUTION) ×3 IMPLANT
WIPE NON LINTING 3.25X3.25 (MISCELLANEOUS) ×3 IMPLANT

## 2015-11-13 NOTE — Transfer of Care (Signed)
Immediate Anesthesia Transfer of Care Note  Patient: Emily Bolton  Procedure(s) Performed: Procedure(s) with comments: CATARACT EXTRACTION PHACO AND INTRAOCULAR LENS PLACEMENT (IOC) (Left) - US 1.04 AP% 22.1 CDE 14.14 FLUID PACK LOT # [id number]  Patient Location: PACU  Anesthesia Type:MAC  Level of Consciousness: awake, alert  and oriented  Airway & Oxygen Therapy: Patient Spontanous Breathing  Post-op Assessment: Report given to RN and Post -op Vital signs reviewed and stable  Post vital signs: Reviewed and stable  Last Vitals:  Filed Vitals:   11/13/15 1002  BP: 138/58  Pulse: 81  Temp: 36.9 C  Resp: 16    Last Pain: There were no vitals filed for this visit.       Complications: No apparent anesthesia complications

## 2015-11-13 NOTE — H&P (Signed)
  All labs reviewed. Abnormal studies sent to patients PCP when indicated.  Previous H&P reviewed, patient examined, there are NO CHANGES.  Gabriel Paulding LOUIS5/30/201711:01 AM

## 2015-11-13 NOTE — Anesthesia Postprocedure Evaluation (Signed)
Anesthesia Post Note  Patient: Emily Bolton  Procedure(s) Performed: Procedure(s) (LRB): CATARACT EXTRACTION PHACO AND INTRAOCULAR LENS PLACEMENT (IOC) (Left)  Patient location during evaluation: PACU Anesthesia Type: MAC Level of consciousness: awake and alert Pain management: pain level controlled Vital Signs Assessment: post-procedure vital signs reviewed and stable Respiratory status: spontaneous breathing, nonlabored ventilation, respiratory function stable and patient connected to nasal cannula oxygen Cardiovascular status: stable and blood pressure returned to baseline Anesthetic complications: no    Last Vitals:  Filed Vitals:   11/13/15 1126 11/13/15 1144  BP: 121/48 115/52  Pulse: 76   Temp: 36.7 C   Resp: 16     Last Pain: There were no vitals filed for this visit.               Cleda MccreedyJoseph K Piscitello

## 2015-11-13 NOTE — Discharge Instructions (Signed)
Eye Surgery Discharge Instructions  Expect mild scratchy sensation or mild soreness. DO NOT RUB YOUR EYE!  The day of surgery:  Minimal physical activity, but bed rest is not required  No reading, computer work, or close hand work  No bending, lifting, or straining.  May watch TV  For 24 hours:  No driving, legal decisions, or alcoholic beverages  Safety precautions  Eat anything you prefer: It is better to start with liquids, then soup then solid foods.  _____ Eye patch should be worn until postoperative exam tomorrow.  ____ Solar shield eyeglasses should be worn for comfort in the sunlight/patch while sleeping  Resume all regular medications including aspirin or Coumadin if these were discontinued prior to surgery. You may shower, bathe, shave, or wash your hair. Tylenol may be taken for mild discomfort.  Call your doctor if you experience significant pain, nausea, or vomiting, fever > 101 or other signs of infection. [phone] or [phone] Specific instructions:  Follow-up Information    Follow up with PORFILIO,WILLIAM LOUIS, MD In 1 day.   Specialty:  Ophthalmology   Why:  May 31 at 10:35am   Contact information:   [address] [place] [address] [phone]

## 2015-11-13 NOTE — Anesthesia Preprocedure Evaluation (Addendum)
Anesthesia Evaluation  Patient identified by MRN, date of birth, ID band Patient awake    Reviewed: Allergy & Precautions, H&P , NPO status , Patient's Chart, lab work & pertinent test results  History of Anesthesia Complications Negative for: history of anesthetic complications  Airway Mallampati: III  TM Distance: <3 FB Neck ROM: limited    Dental  (+) Poor Dentition, Missing, Upper Dentures, Lower Dentures   Pulmonary neg pulmonary ROS, neg shortness of breath,    Pulmonary exam normal breath sounds clear to auscultation       Cardiovascular Exercise Tolerance: Good hypertension, negative cardio ROS Normal cardiovascular exam Rhythm:regular Rate:Normal     Neuro/Psych negative neurological ROS  negative psych ROS   GI/Hepatic negative GI ROS, Neg liver ROS,   Endo/Other  negative endocrine ROS  Renal/GU negative Renal ROS  negative genitourinary   Musculoskeletal  (+) Arthritis ,   Abdominal   Peds  Hematology negative hematology ROS (+)   Anesthesia Other Findings Past Medical History:   Hypertension                                                 Arthritis                   HLD                                  History reviewed. No pertinent surgical history.  BMI    Body Mass Index   19.73 kg/m 2      Reproductive/Obstetrics negative OB ROS                            Anesthesia Physical  Anesthesia Plan  ASA: III  Anesthesia Plan: MAC   Post-op Pain Management:    Induction:   Airway Management Planned:   Additional Equipment:   Intra-op Plan:   Post-operative Plan:   Informed Consent: I have reviewed the patients History and Physical, chart, labs and discussed the procedure including the risks, benefits and alternatives for the proposed anesthesia with the patient or authorized representative who has indicated his/her understanding and acceptance.   Dental  Advisory Given  Plan Discussed with: Anesthesiologist, CRNA and Surgeon  Anesthesia Plan Comments: (Consent via video interpreter )        Anesthesia Quick Evaluation

## 2015-11-13 NOTE — Op Note (Signed)
PREOPERATIVE DIAGNOSIS:  Nuclear sclerotic cataract of the left eye.   POSTOPERATIVE DIAGNOSIS:  NUCLEAR SCLEROTIC CATARACT LEFT EYE   OPERATIVE PROCEDURE:  Procedure(s): CATARACT EXTRACTION PHACO AND INTRAOCULAR LENS PLACEMENT (IOC)   SURGEON:  Galen ManilaWilliam Granville Whitefield, MD.   ANESTHESIA:   Anesthesiologist: Rosaria FerriesJoseph K Piscitello, MD CRNA: Evelena Peateborah Ferrero-Conover, CRNA  1.      Managed anesthesia care. 2.      Topical tetracaine drops followed by 2% Xylocaine jelly applied in the preoperative holding area.   COMPLICATIONS:  None.   TECHNIQUE:   Stop and chop   DESCRIPTION OF PROCEDURE:  The patient was examined and consented in the preoperative holding area where the aforementioned topical anesthesia was applied to the left eye and then brought back to the Operating Room where the left eye was prepped and draped in the usual sterile ophthalmic fashion and a lid speculum was placed. A paracentesis was created with the side port blade and the anterior chamber was filled with viscoelastic. A near clear corneal incision was performed with the steel keratome. A continuous curvilinear capsulorrhexis was performed with a cystotome followed by the capsulorrhexis forceps. Hydrodissection and hydrodelineation were carried out with BSS on a blunt cannula. The lens was removed in a stop and chop  technique and the remaining cortical material was removed with the irrigation-aspiration handpiece. The capsular bag was inflated with viscoelastic and the Technis ZCB00 lens was placed in the capsular bag without complication. The remaining viscoelastic was removed from the eye with the irrigation-aspiration handpiece. The wounds were hydrated. The anterior chamber was flushed with Miostat and the eye was inflated to physiologic pressure. 0.1 mL of cefuroxime concentration 10 mg/mL was placed in the anterior chamber. The wounds were found to be water tight. The eye was dressed with Vigamox. The patient was given protective  glasses to wear throughout the day and a shield with which to sleep tonight. The patient was also given drops with which to begin a drop regimen today and will follow-up with me in one day.  Implant Name Type Inv. Item Serial No. Manufacturer Lot No. LRB No. Used  LENS IOL DIOP 21.0 - [id number] Intraocular Lens LENS IOL DIOP 21.0 [id number]   Left 1   Procedure(s) with comments: CATARACT EXTRACTION PHACO AND INTRAOCULAR LENS PLACEMENT (IOC) (Left) - US 1.04 AP% 22.1 CDE 14.14 FLUID PACK LOT # [id number]  Electronically signed: Barbara Keng [place] 11/13/2015 11:25 AM

## 2015-11-17 LAB — CULTURE, BLOOD (ROUTINE X 2)
CULTURE: NO GROWTH
Culture: NO GROWTH

## 2016-03-20 ENCOUNTER — Other Ambulatory Visit: Payer: Self-pay | Admitting: Internal Medicine

## 2016-03-20 DIAGNOSIS — Z1231 Encounter for screening mammogram for malignant neoplasm of breast: Secondary | ICD-10-CM

## 2016-04-14 ENCOUNTER — Ambulatory Visit
Admission: RE | Admit: 2016-04-14 | Discharge: 2016-04-14 | Disposition: A | Discharge status: Home / Self Care | Payer: Medicare Other | Source: Ambulatory Visit | Attending: Internal Medicine | Admitting: Internal Medicine

## 2016-04-14 DIAGNOSIS — Z1231 Encounter for screening mammogram for malignant neoplasm of breast: Secondary | ICD-10-CM | POA: Diagnosis present

## 2019-04-27 ENCOUNTER — Other Ambulatory Visit: Payer: Self-pay | Admitting: Internal Medicine

## 2019-04-27 DIAGNOSIS — Z1231 Encounter for screening mammogram for malignant neoplasm of breast: Secondary | ICD-10-CM

## 2019-06-29 ENCOUNTER — Other Ambulatory Visit
Admission: RE | Admit: 2019-06-29 | Discharge: 2019-06-29 | Disposition: A | Discharge status: Home / Self Care | Payer: Medicare Other | Source: Ambulatory Visit | Attending: Sports Medicine | Admitting: Sports Medicine

## 2019-06-29 DIAGNOSIS — M25571 Pain in right ankle and joints of right foot: Secondary | ICD-10-CM | POA: Diagnosis present

## 2019-06-29 DIAGNOSIS — M19079 Primary osteoarthritis, unspecified ankle and foot: Secondary | ICD-10-CM | POA: Diagnosis present

## 2019-06-29 DIAGNOSIS — M25471 Effusion, right ankle: Secondary | ICD-10-CM | POA: Diagnosis present

## 2019-06-29 LAB — SYNOVIAL CELL COUNT + DIFF, W/ CRYSTALS
Eosinophils-Synovial: 0 %
Lymphocytes-Synovial Fld: 8 %
Monocyte-Macrophage-Synovial Fluid: 2 %
Neutrophil, Synovial: 90 %
WBC, Synovial: 27571 /mm3 — ABNORMAL HIGH (ref 0–200)

## 2019-08-23 ENCOUNTER — Ambulatory Visit
Admission: RE | Admit: 2019-08-23 | Discharge: 2019-08-23 | Disposition: A | Discharge status: Home / Self Care | Payer: Medicare Other | Source: Ambulatory Visit | Attending: Internal Medicine | Admitting: Internal Medicine

## 2019-08-23 DIAGNOSIS — Z1231 Encounter for screening mammogram for malignant neoplasm of breast: Secondary | ICD-10-CM

## 2020-11-05 ENCOUNTER — Other Ambulatory Visit: Payer: Self-pay | Admitting: Internal Medicine

## 2020-11-05 DIAGNOSIS — Z1231 Encounter for screening mammogram for malignant neoplasm of breast: Secondary | ICD-10-CM

## 2020-11-07 ENCOUNTER — Ambulatory Visit
Admission: RE | Admit: 2020-11-07 | Discharge: 2020-11-07 | Disposition: A | Discharge status: Home / Self Care | Payer: Medicare Other | Source: Ambulatory Visit | Attending: Internal Medicine | Admitting: Internal Medicine

## 2020-11-07 ENCOUNTER — Other Ambulatory Visit: Payer: Self-pay

## 2020-11-07 DIAGNOSIS — Z1231 Encounter for screening mammogram for malignant neoplasm of breast: Secondary | ICD-10-CM | POA: Insufficient documentation

## 2021-03-29 ENCOUNTER — Other Ambulatory Visit: Payer: Self-pay | Admitting: Internal Medicine

## 2021-03-29 DIAGNOSIS — N1831 Chronic kidney disease, stage 3a: Secondary | ICD-10-CM

## 2021-09-19 IMAGING — MG MM DIGITAL SCREENING BILAT W/ TOMO AND CAD
8 series · 9 of 24 positions shown · non-contrast
Comparison: Previous exam(s).

CLINICAL DATA: Screening.

EXAM:
DIGITAL SCREENING BILATERAL MAMMOGRAM WITH TOMOSYNTHESIS AND CAD
TECHNIQUE: Bilateral screening digital craniocaudal and mediolateral oblique
mammograms were obtained. Bilateral screening digital breast
tomosynthesis was performed. The images were evaluated with
computer-aided detection.

[R CC synth-2D]
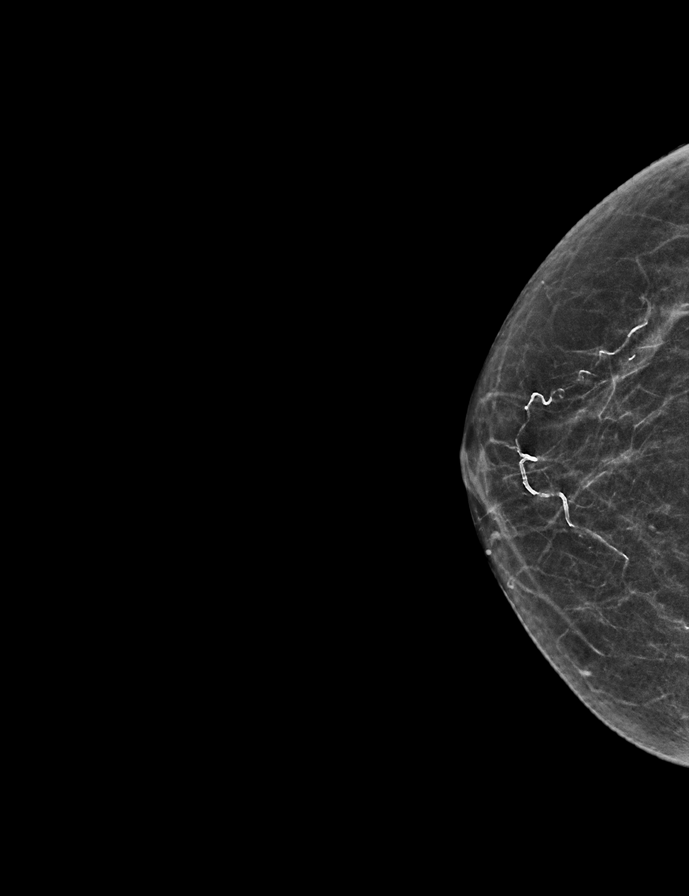

[L MLO synth-2D]
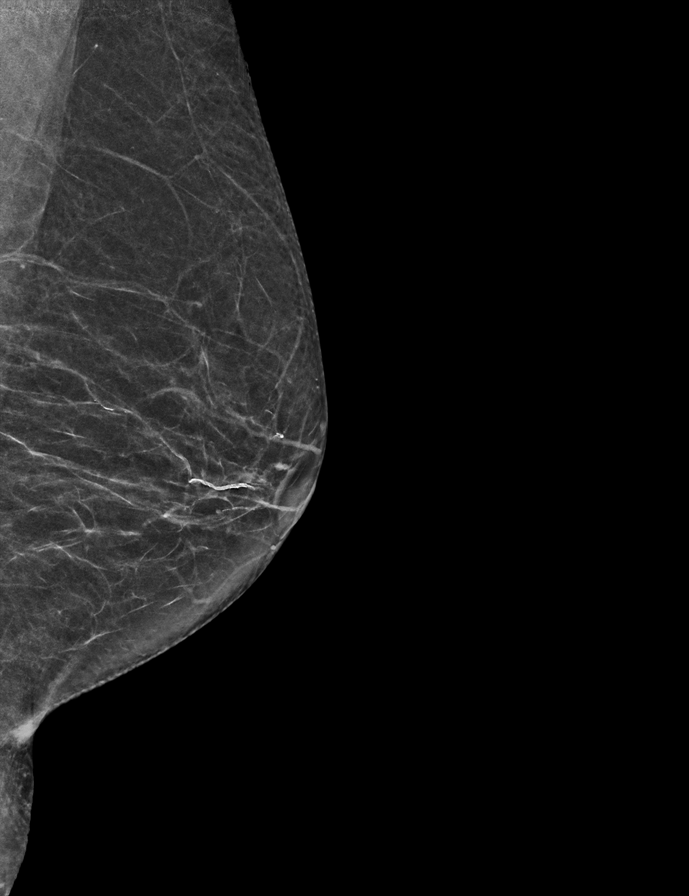

[L CC synth-2D]
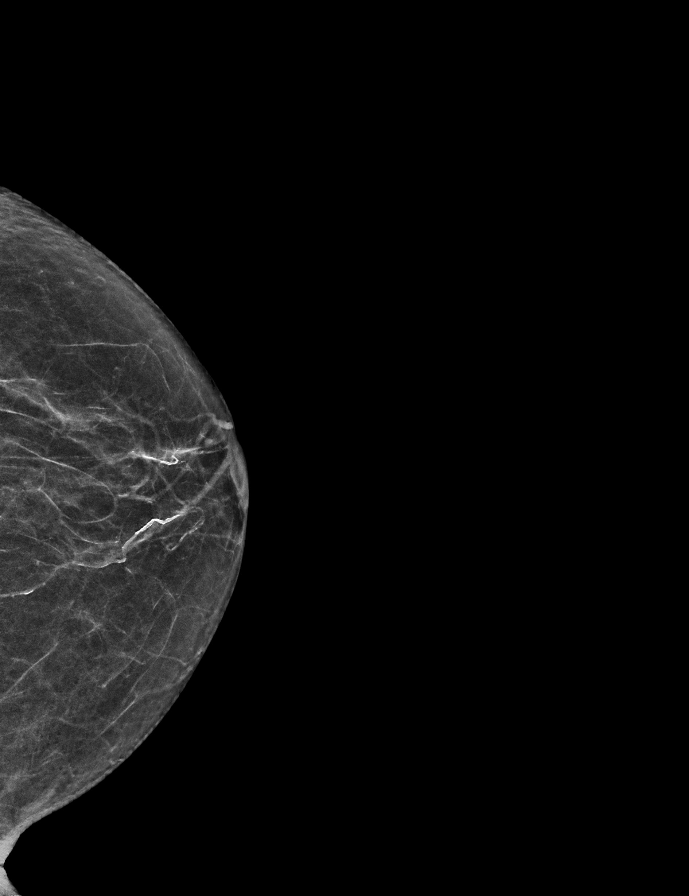

[R MLO synth-2D]
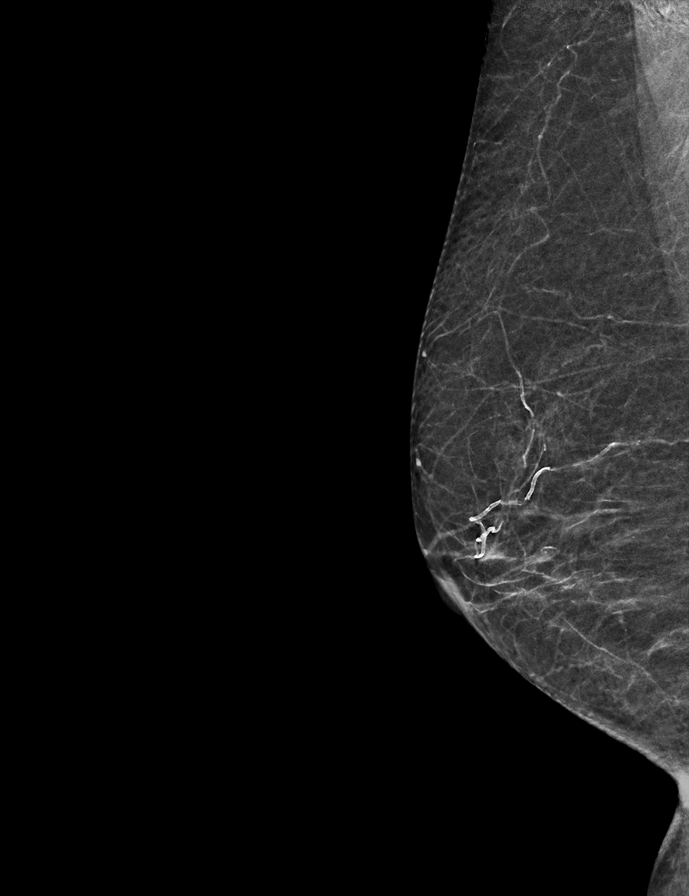

[R MLO tomo · 2 of 40 frames shown]
[frame 13/40]
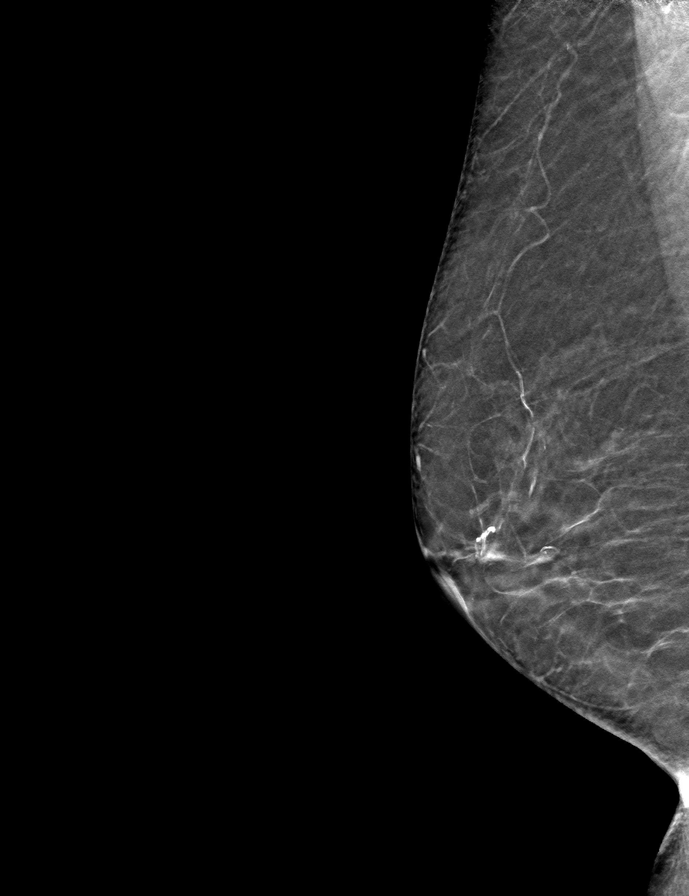
[frame 21/40]
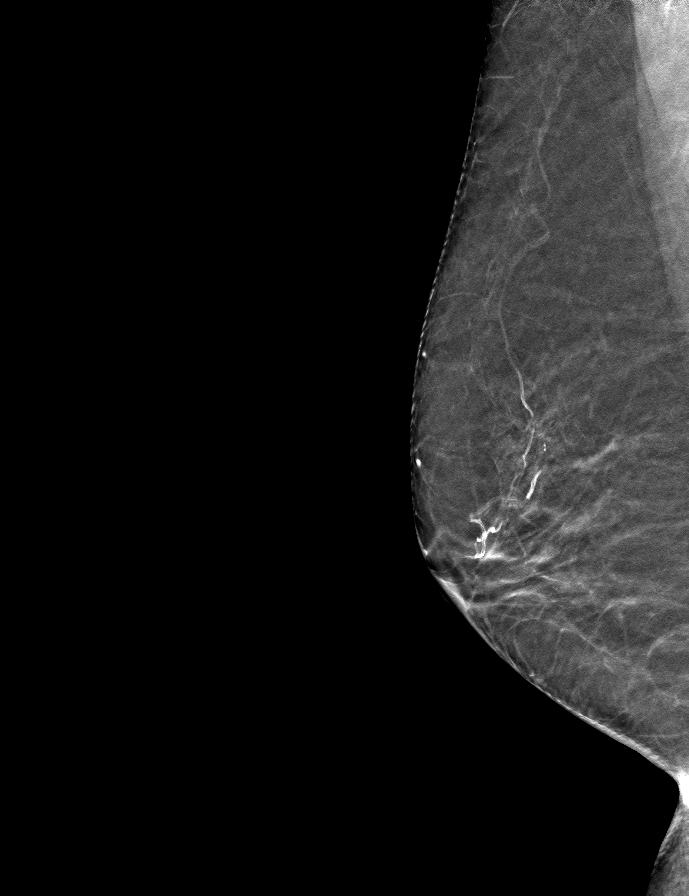

[L MLO tomo · tomo slice 27/54.0]
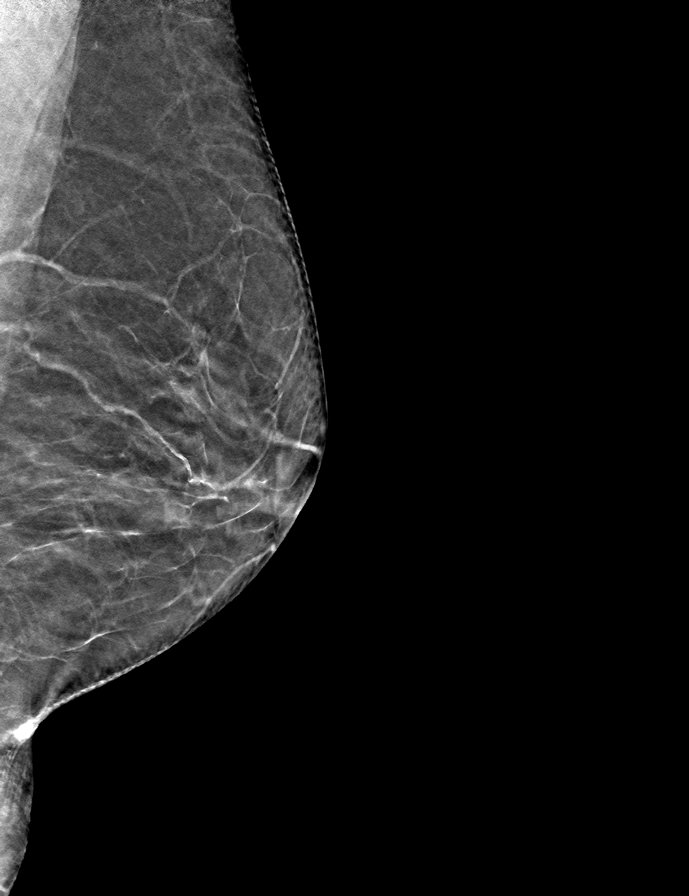

[R CC tomo · tomo slice 25/50.0]
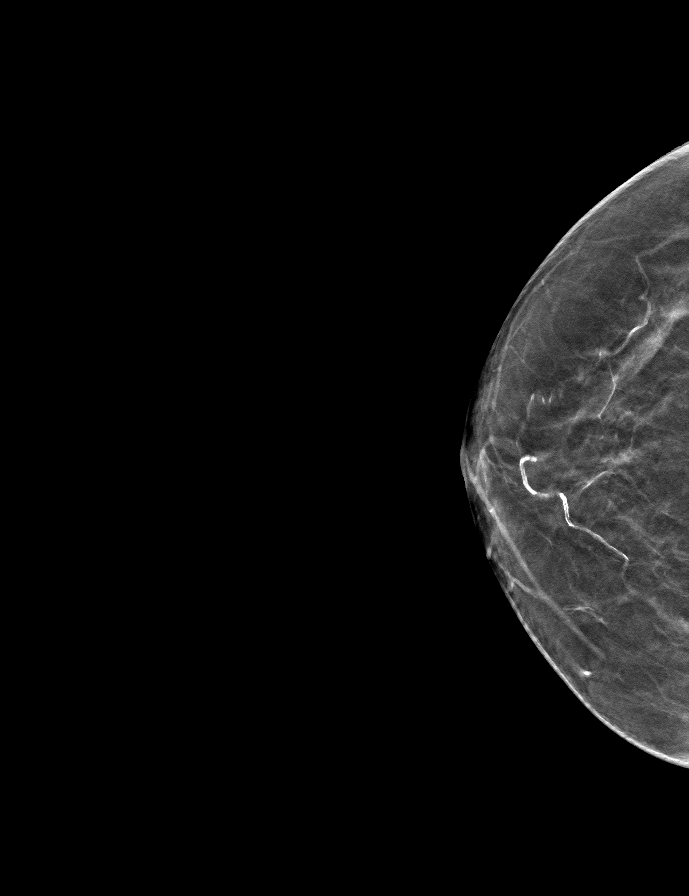

[L CC tomo · tomo slice 25/50.0]
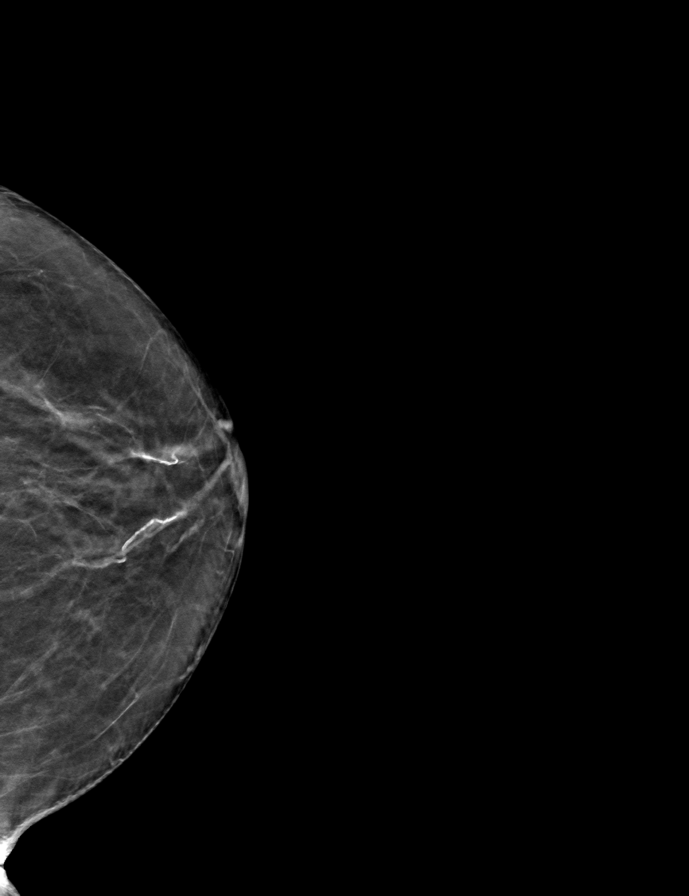

[9 of 24 positions shown; findings below may reference images not displayed]

ACR Breast Density Category b: There are scattered areas of
fibroglandular density.
FINDINGS: There are no findings suspicious for malignancy. The images were
evaluated with computer-aided detection.
IMPRESSION: No mammographic evidence of malignancy. A result letter of this
screening mammogram will be mailed directly to the patient.

RECOMMENDATION:
Screening mammogram in one year. (Code:[id number])

BI-RADS CATEGORY  1: Negative.
